# Patient Record
Sex: Male | Born: 1950
Health system: Southern US, Community
[De-identification: ages and names within clinical notes are randomized; demographics above are authoritative.]

## PROBLEM LIST (undated history)

## (undated) DIAGNOSIS — I10 Essential (primary) hypertension: Secondary | ICD-10-CM

---

## 1998-08-12 ENCOUNTER — Emergency Department (HOSPITAL_COMMUNITY): Admission: EM | Admit: 1998-08-12 | Discharge: 1998-08-12 | Payer: Self-pay | Admitting: Emergency Medicine

## 2010-05-07 ENCOUNTER — Encounter: Payer: Self-pay | Admitting: Internal Medicine

## 2015-08-02 ENCOUNTER — Other Ambulatory Visit: Payer: Self-pay | Admitting: Internal Medicine

## 2015-08-02 DIAGNOSIS — Z139 Encounter for screening, unspecified: Secondary | ICD-10-CM

## 2015-08-11 ENCOUNTER — Other Ambulatory Visit: Payer: Self-pay | Admitting: Internal Medicine

## 2015-08-11 ENCOUNTER — Ambulatory Visit
Admission: RE | Admit: 2015-08-11 | Discharge: 2015-08-11 | Disposition: A | Discharge status: Home / Self Care | Payer: Medicare Other | Source: Ambulatory Visit | Attending: Internal Medicine | Admitting: Internal Medicine

## 2015-08-11 DIAGNOSIS — Z139 Encounter for screening, unspecified: Secondary | ICD-10-CM

## 2016-06-20 ENCOUNTER — Emergency Department (HOSPITAL_COMMUNITY)
Admission: EM | Admit: 2016-06-20 | Discharge: 2016-06-20 | Disposition: A | Discharge status: Home / Self Care | Payer: Medicare Other | Attending: Emergency Medicine | Admitting: Emergency Medicine

## 2016-06-20 ENCOUNTER — Encounter (HOSPITAL_COMMUNITY): Payer: Self-pay

## 2016-06-20 ENCOUNTER — Emergency Department (HOSPITAL_COMMUNITY): Payer: Medicare Other

## 2016-06-20 ENCOUNTER — Ambulatory Visit (INDEPENDENT_AMBULATORY_CARE_PROVIDER_SITE_OTHER): Payer: Medicare Other | Admitting: Physician Assistant

## 2016-06-20 VITALS — BP 128/82 | HR 95 | Temp 98.7°F | Resp 16 | Ht 72.0 in | Wt 198.0 lb

## 2016-06-20 DIAGNOSIS — S62521B Displaced fracture of distal phalanx of right thumb, initial encounter for open fracture: Secondary | ICD-10-CM | POA: Insufficient documentation

## 2016-06-20 DIAGNOSIS — W540XXA Bitten by dog, initial encounter: Secondary | ICD-10-CM

## 2016-06-20 DIAGNOSIS — I10 Essential (primary) hypertension: Secondary | ICD-10-CM | POA: Insufficient documentation

## 2016-06-20 DIAGNOSIS — Y929 Unspecified place or not applicable: Secondary | ICD-10-CM | POA: Insufficient documentation

## 2016-06-20 DIAGNOSIS — Y939 Activity, unspecified: Secondary | ICD-10-CM | POA: Diagnosis not present

## 2016-06-20 DIAGNOSIS — T148XXA Other injury of unspecified body region, initial encounter: Secondary | ICD-10-CM

## 2016-06-20 DIAGNOSIS — S61111A Laceration without foreign body of right thumb with damage to nail, initial encounter: Secondary | ICD-10-CM

## 2016-06-20 DIAGNOSIS — S61151A Open bite of right thumb with damage to nail, initial encounter: Secondary | ICD-10-CM | POA: Diagnosis present

## 2016-06-20 DIAGNOSIS — Y999 Unspecified external cause status: Secondary | ICD-10-CM | POA: Insufficient documentation

## 2016-06-20 DIAGNOSIS — Z79899 Other long term (current) drug therapy: Secondary | ICD-10-CM | POA: Insufficient documentation

## 2016-06-20 DIAGNOSIS — S61051A Open bite of right thumb without damage to nail, initial encounter: Secondary | ICD-10-CM

## 2016-06-20 HISTORY — DX: Essential (primary) hypertension: I10

## 2016-06-20 MED ORDER — AMOXICILLIN-POT CLAVULANATE 875-125 MG PO TABS: 1.0000 | ORAL_TABLET | Freq: Once | ORAL | Status: DC

## 2016-06-20 MED ORDER — MORPHINE SULFATE (PF) 4 MG/ML IV SOLN
4.0000 mg | Freq: Once | INTRAVENOUS | Status: AC
  Administered 2016-06-20: 4 mg via INTRAMUSCULAR
  Filled 2016-06-20: qty 1

## 2016-06-20 NOTE — ED Provider Notes (Signed)
[facility]-EMERGENCY DEPT Provider Note   CSN: [id number] Arrival date & time: 06/20/16  0845     History   Chief Complaint Chief Complaint  Patient presents with  . Animal Bite    HPI Billy Hicks is a 66 y.o. male.  HPI Patient presents immediately after sustaining injuries from a dog bite. Patient is generally well, was in his usual state of health until this morning. About 2 hours ago the patient tried to separate 2 dogs were fighting. He sustained several injuries to his arms, is a superficial, but his right thumb had multiple wounds. Since that time there's been pain persistently in the thumb, but no loss of sensation, no weakness. Patient went to urgent care, had initial evaluation, was sent here due to the severity of the right thumb wound. Patient had tetanus shot last month, and dogs have updated vaccination status, no rabies. No other injuries, no other complaints. Last meal was this morning, coffee with cream.   Past Medical History:  Diagnosis Date  . Hypertension     There are no active problems to display for this patient.   History reviewed. No pertinent surgical history.     Home Medications    Prior to Admission medications   Medication Sig Start Date End Date Taking? Authorizing Provider  valsartan-hydrochlorothiazide (DIOVAN-HCT) 80-12.5 MG tablet Take 1 tablet by mouth daily.   Yes Historical Provider, MD    Family History History reviewed. No pertinent family history.  Social History Social History  Substance Use Topics  . Smoking status: Never Smoker  . Smokeless tobacco: Never Used  . Alcohol use No     Allergies   Patient has no known allergies.   Review of Systems Review of Systems  Constitutional:       Per HPI, otherwise negative  HENT:       Per HPI, otherwise negative  Gastrointestinal: Negative for nausea and vomiting.  Endocrine:       Negative aside from HPI  Genitourinary:       Neg aside from HPI     Musculoskeletal:       Per HPI, otherwise negative  Skin: Positive for color change and wound.  Allergic/Immunologic: Negative for immunocompromised state.  Neurological: Negative for weakness and numbness.  Hematological: Negative.      Physical Exam Updated Vital Signs BP 135/90 (BP Location: Left Arm)   Pulse 73   Temp 97.8 F (36.6 C) (Oral)   Resp 18   SpO2 99%   Physical Exam  Constitutional: He is oriented to person, place, and time. He appears well-developed. No distress.  HENT:  Head: Normocephalic and atraumatic.  Eyes: Conjunctivae and EOM are normal.  Cardiovascular: Normal rate, regular rhythm and intact distal pulses.   Pulmonary/Chest: Effort normal. No stridor. No respiratory distress.  Musculoskeletal: He exhibits no edema.  C wound exam for description of injury. Patient has appropriate capacity for flexion, extension, opposition, opposition of the thumb  Neurological: He is alert and oriented to person, place, and time.  Patient has preserved sensation throughout the description of the thumb, strength grossly intact,  Skin: Skin is warm and dry.  Open wound of the thumb through the nail, with fragmentation of the nailbed, visible soft tissue. See picture.  Psychiatric: He has a normal mood and affect.  Nursing note and vitals reviewed.      ED Treatments / Results   Radiology Dg Finger Thumb Right  Result Date: 06/20/2016 CLINICAL DATA:  Dog bite  to rt thumb this a.m trying to break up dog fight between pts own dogs. Pain, laceration to distal rt thumb EXAM: RIGHT THUMB 2+V COMPARISON:  None. FINDINGS: Transverse minimally comminuted fracture across the tuft of the distal phalanx of the thumb. Major fracture fragments distracted approximately 2 mm. No extension to the articular surface. Overlying soft tissue swelling. IMPRESSION: 1. Minimally comminuted transverse presumably open fracture, tuft distal phalanx right thumb. Electronically Signed   By: Corlis Leak M.D.   On: 06/20/2016 09:13    After the initial evaluation I discussed patient's case with her hand surgery team.  Procedures Procedures (including critical care time)  Medications Ordered in ED Morphine  11:01 AM Patient will proceed to our surgical center, hand surgeon is awaiting his arrival.  Initial Impression / Assessment and Plan / ED Course  I have reviewed the triage vital signs and the nursing notes.  Pertinent labs & imaging results that were available during my care of the patient were reviewed by me and considered in my medical decision making (see chart for details).  Patient presents after sustaining an injury to his right hand from a dog bite. Patient has a complex wound with comminuted fracture through the nailbed. Given the dirty nature of the wound I discussed this case with our hand surgeon for definitive irrigation, evaluation. Patient is neurovascularly intact, has up-to-date tetanus status. Patient will proceed directly to our outpatient surgical center. Patient has not yet received his antibiotics, but will do so if there. Pain well-controlled here.    Gerhard Munch, MD 06/20/16 1102

## 2016-06-20 NOTE — ED Triage Notes (Signed)
Pt here with dog bite to rt thumb. Known dog and up to date on shots.  Urgent care called and states bite may be to bone.  Pt is up to date on his tetanus.

## 2016-06-20 NOTE — Patient Instructions (Addendum)
  Please go to [facility]: [address], [place], [place] [address] Follow-up with me in 1 week.   Thank you for coming in today. I hope you feel we met your needs.  Feel free to call [facility] if you have any questions or further requests.  Please consider signing up for MyChart if you do not already have it, as this is a great way to communicate with me.  Best,  Whitney McVey, PA-C  IF you received an x-ray today, you will receive an invoice from [facility] Radiology. Please contact [facility] at [phone] with questions or concerns regarding your invoice.   IF you received labwork today, you will receive an invoice from [place]. Please contact [facility] at [phone] with questions or concerns regarding your invoice.   Our billing staff will not be able to assist you with questions regarding bills from these companies.  You will be contacted with the lab results as soon as they are available. The fastest way to get your results is to activate your My Chart account. Instructions are located on the last page of this paperwork. If you have not heard from [place] regarding the results in 2 weeks, please contact this office.

## 2016-06-20 NOTE — Discharge Instructions (Signed)
Please be sure to proceed to the surgical Center. Dr. Mina MarbleWeingold and his team are awaiting your arrival.

## 2016-06-20 NOTE — Progress Notes (Signed)
   Billy Hicks  MRN: [id number] DOB: 01/02/1951  PCP: No primary care provider on file.  Subjective:  Pt is a pleasant 66 year old male who presents to clinic for dog bite on right thumb. He was breaking up a dog fight this morning when he was bit. This happened about an hour and a half ago. He has not cleaned it.  UTD tetanus shot. Denies numbness, tingling, weakness, fever, chills.   Review of Systems  Constitutional: Negative for chills, diaphoresis and fever.  Respiratory: Negative for cough, chest tightness, shortness of breath and wheezing.   Cardiovascular: Negative for chest pain and palpitations.  Gastrointestinal: Negative for abdominal pain, nausea and vomiting.  Skin: Positive for wound.    There are no active problems to display for this patient.   No current outpatient prescriptions on file prior to visit.   No current facility-administered medications on file prior to visit.     No Known Allergies   Objective:  BP 128/82 (BP Location: Right Arm, Patient Position: Sitting, Cuff Size: Normal)   Pulse 95   Temp 98.7 F (37.1 C) (Oral)   Resp 16   Ht 6' (1.829 m)   Wt 198 lb (89.8 kg)   SpO2 98%   BMI 26.85 kg/m   Physical Exam  Constitutional: He is oriented to person, place, and time and well-developed, well-nourished, and in no distress. No distress.  Cardiovascular: Normal rate, regular rhythm and normal heart sounds.   Neurological: He is alert and oriented to person, place, and time. GCS score is 15.  Skin: Skin is warm and dry.  Full-thickness laceration to medial aspect of right thumb, extending anteriorly to posterior. Laceration penetrates over half of nailbed. Possible involvement of distal phalanges.    Psychiatric: Mood, memory, affect and judgment normal.  Vitals reviewed.   Assessment and Plan :  1. Dog bite of right thumb, initial encounter 2. Laceration of right thumb with damage to nail, foreign body presence unspecified, initial  encounter - The extent of this injury is beyond the scope of what we can do here for this patient. Pt requires consultation with hand surgeon in addition to antibiotics. Will send him by personal vehicle to [facility]. I called and spoke with charge nurse to let them know he is on his way, they are expecting him. He understands and agrees.   Marco CollieWhitney Neola Worrall, PA-C  Primary Care at [facility] 06/20/2016 8:27 AM

## 2017-08-28 IMAGING — US US AORTA SCREENING (MEDICARE)
1 series · 10 of 10 positions shown · non-contrast
Comparison: None.

CLINICAL DATA: Medicare screening exam for abdominal aortic
aneurysm.

EXAM:
ABDOMINAL AORTA SCREENING ULTRASOUND
TECHNIQUE: Ultrasound examination of the abdominal aorta was performed as a
screening evaluation for abdominal aortic aneurysm.

[Series 1: us aorta screening (medicare) · 0.28mm/px · 10 of 10 slices shown]
[im 1/10]
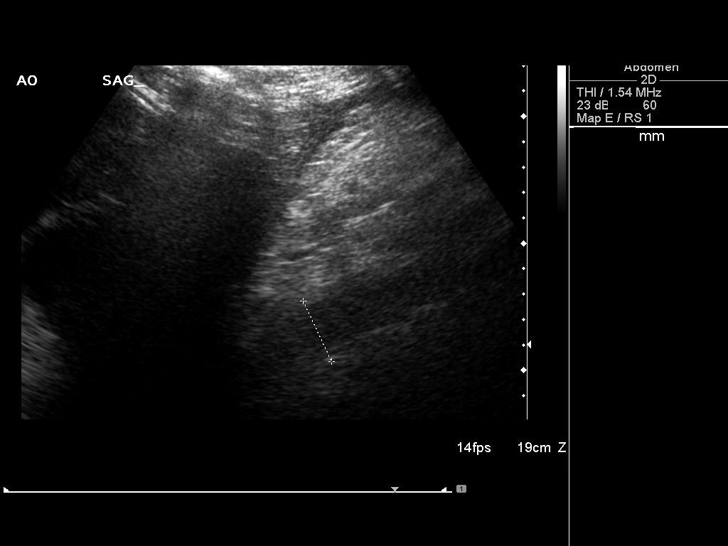
[im 2/10]
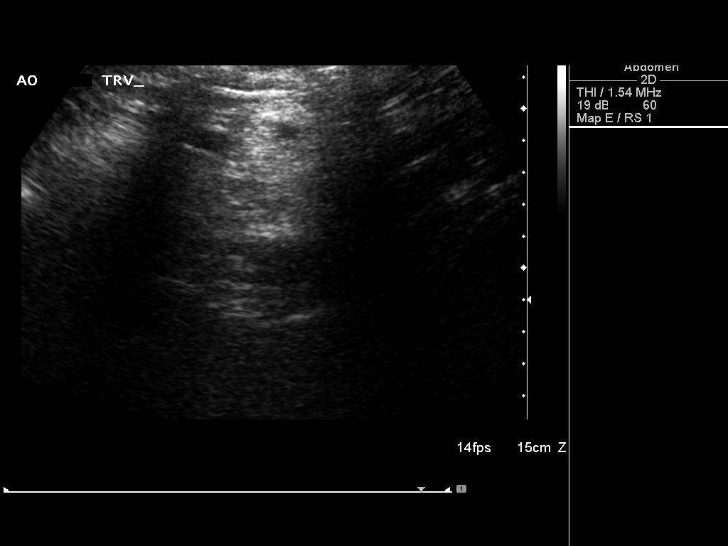
[im 3/10]
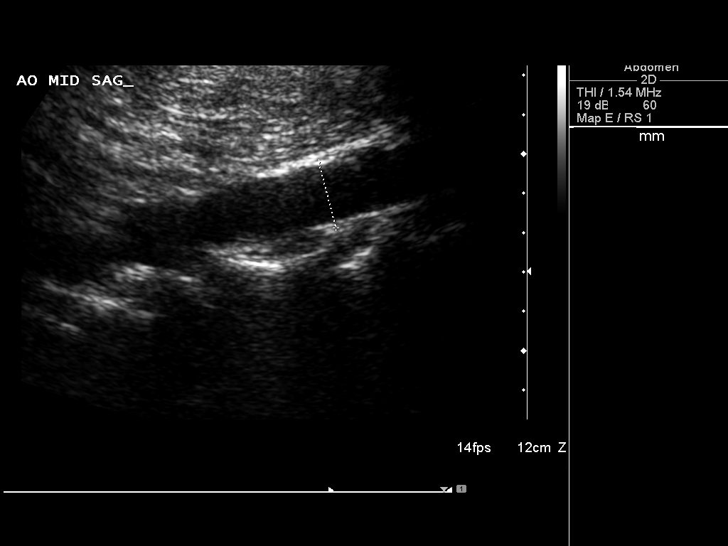
[im 4/10]
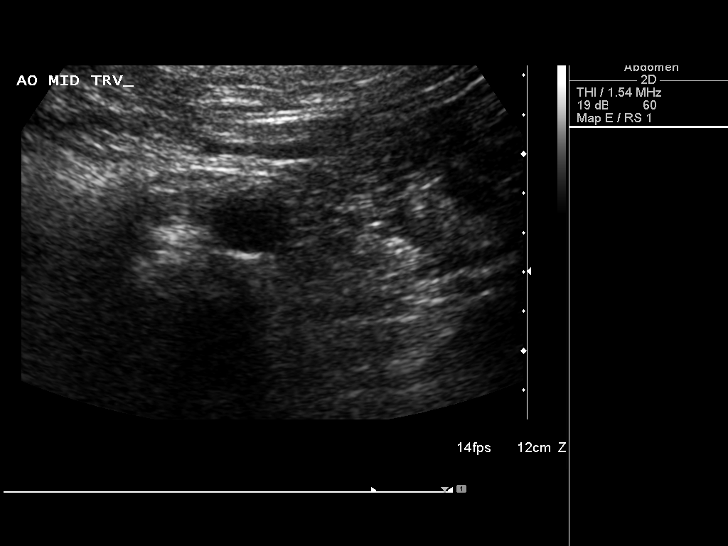
[im 5/10]
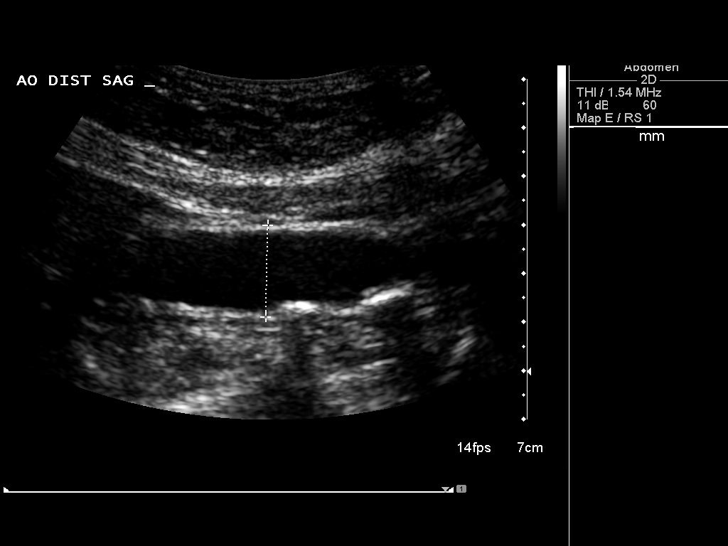
[im 6/10]
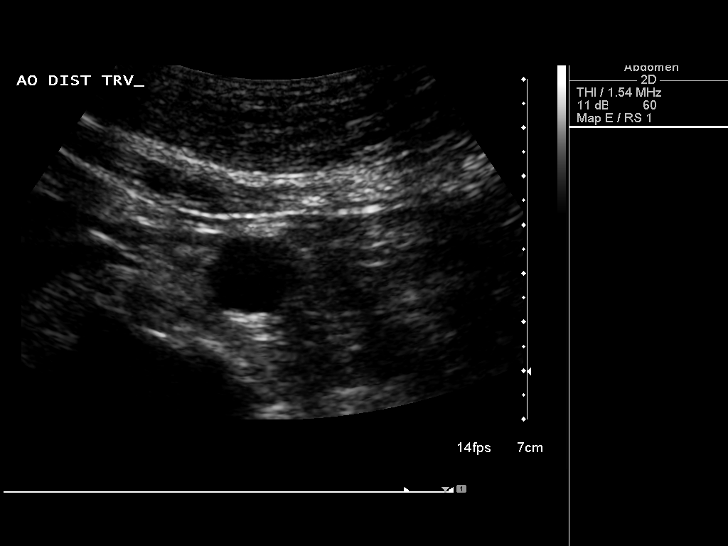
[im 7/10]
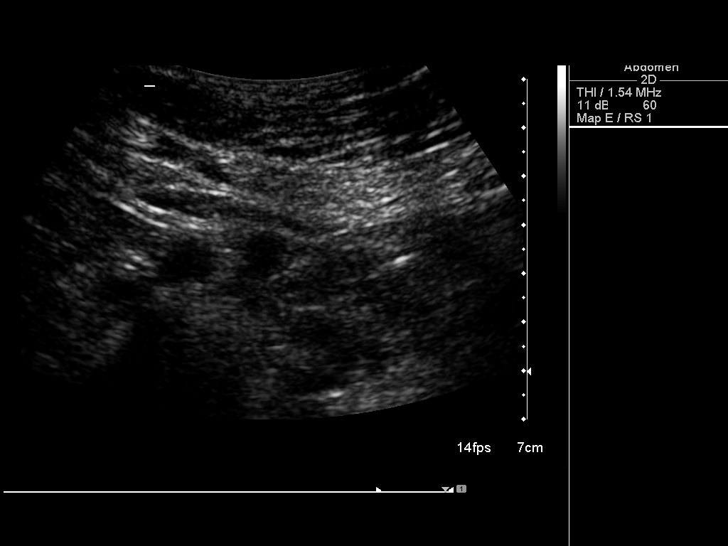
[im 8/10]
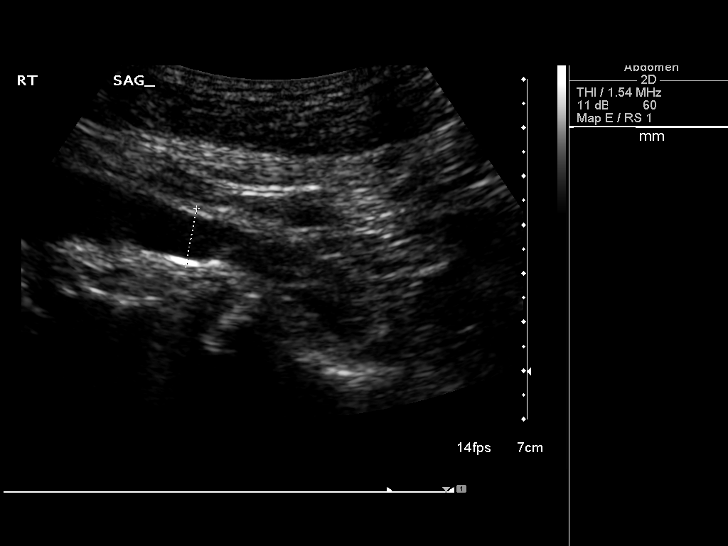
[im 9/10]
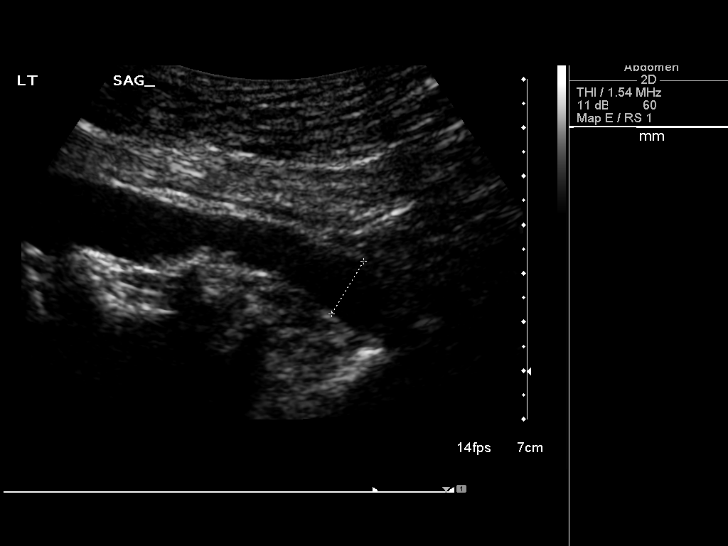
[im 10/10]
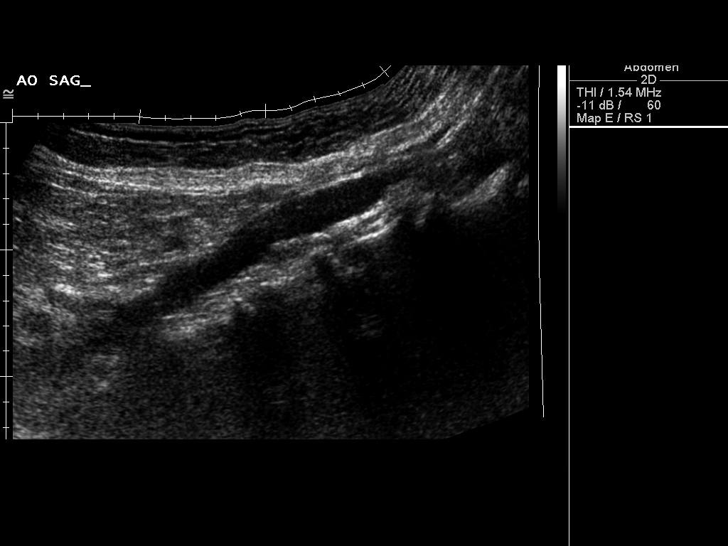

[10 of 10 positions shown; findings below may reference images not displayed]

FINDINGS: Abdominal Aorta

No aneurysm identified.  Diffuse atherosclerotic changes present.

Maximum Diameter: 2.6 cm
IMPRESSION: No aneurysm identified

## 2018-07-08 IMAGING — CR DG FINGER THUMB 2+V*R*
3 series · 3 of 3 positions shown · non-contrast
Comparison: None.

CLINICAL DATA: Dog bite to rt thumb this a.m trying to break up dog
fight between pts own dogs. Pain, laceration to distal rt thumb

EXAM:
RIGHT THUMB 2+V

[x finger pa right]
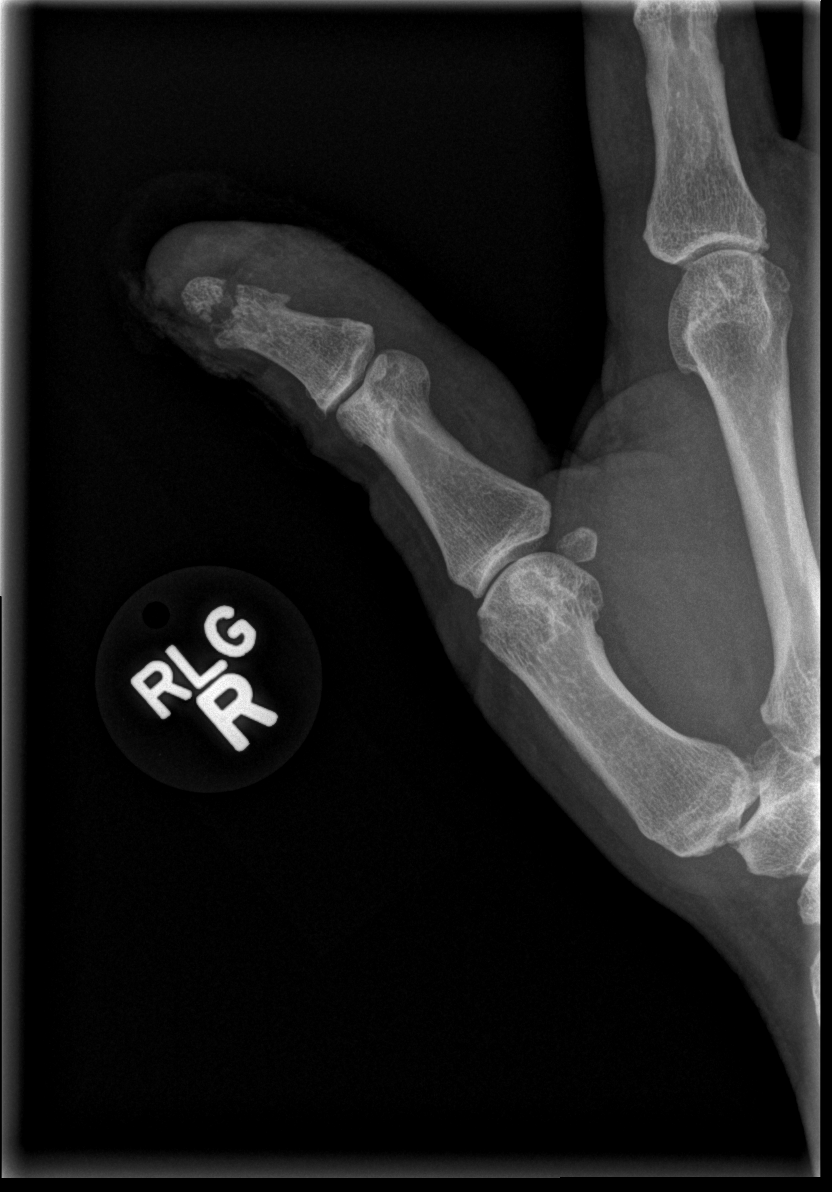

[x finger obl right]
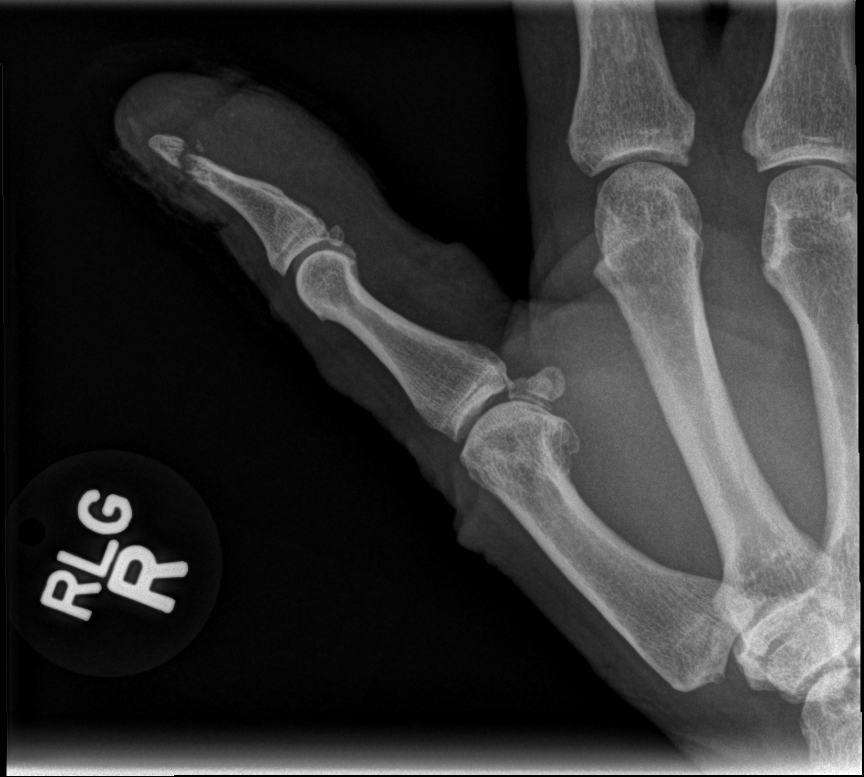

[x finger lat right]
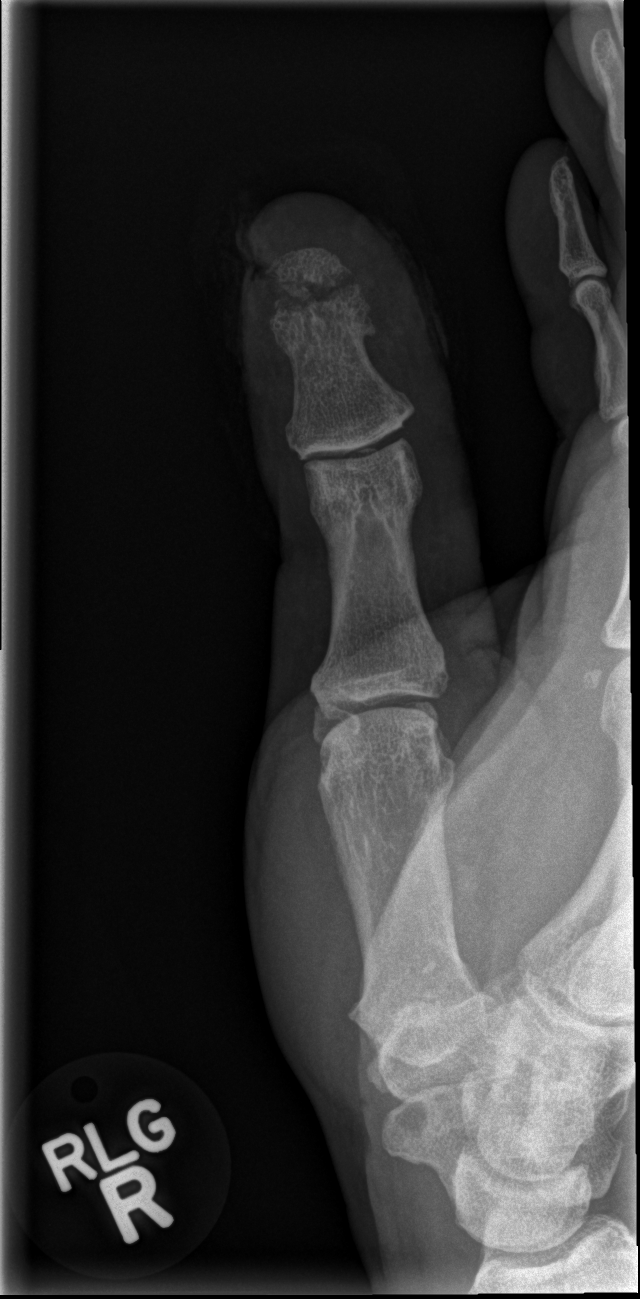

[3 of 3 positions shown; findings below may reference images not displayed]

FINDINGS: Transverse minimally comminuted fracture across the tuft of the
distal phalanx of the thumb. Major fracture fragments distracted
approximately 2 mm. No extension to the articular surface. Overlying
soft tissue swelling.
IMPRESSION: 1. Minimally comminuted transverse presumably open fracture, tuft
distal phalanx right thumb.

## 2019-05-05 ENCOUNTER — Ambulatory Visit: Payer: PPO | Attending: Internal Medicine

## 2019-05-05 DIAGNOSIS — Z23 Encounter for immunization: Secondary | ICD-10-CM | POA: Insufficient documentation

## 2019-05-05 NOTE — Progress Notes (Signed)
   Covid-19 Vaccination Clinic  Name:  Billy Hicks    MRN: [id number] DOB: 1950-12-07  05/05/2019  Mr. Billy Hicks was observed post Covid-19 immunization for 15 minutes without incidence. He was provided with Vaccine Information Sheet and instruction to access the V-Safe system.   Mr. Billy Hicks was instructed to call 911 with any severe reactions post vaccine: Marland Kitchen Difficulty breathing  . Swelling of your face and throat  . A fast heartbeat  . A bad rash all over your body  . Dizziness and weakness    Immunizations Administered    Name Date Dose VIS Date Route   Pfizer COVID-19 Vaccine 05/05/2019  3:30 PM 0.3 mL 03/27/2019 Intramuscular   Manufacturer: [employer], [employer]   Lot: [id number]   NDC: [address]-5

## 2019-05-25 ENCOUNTER — Ambulatory Visit: Payer: PPO | Attending: Internal Medicine

## 2019-05-25 DIAGNOSIS — Z23 Encounter for immunization: Secondary | ICD-10-CM

## 2019-05-25 NOTE — Progress Notes (Signed)
   Covid-19 Vaccination Clinic  Name:  Billy Hicks    MRN: [id number] DOB: 1950-11-16  05/25/2019  [name] was observed post Covid-19 immunization for 15 minutes without incidence. He was provided with Vaccine Information Sheet and instruction to access the V-Safe system.   [name] was instructed to call 911 with any severe reactions post vaccine: Marland Kitchen Difficulty breathing  . Swelling of your face and throat  . A fast heartbeat  . A bad rash all over your body  . Dizziness and weakness    Immunizations Administered    Name Date Dose VIS Date Route   Pfizer COVID-19 Vaccine 05/25/2019 12:41 PM 0.3 mL 03/27/2019 Intramuscular   Manufacturer: [employer], [employer]   Lot: [id number]   NDC: [address]-0

## 2019-08-25 DIAGNOSIS — Z0000 Encounter for general adult medical examination without abnormal findings: Secondary | ICD-10-CM | POA: Diagnosis not present

## 2019-08-25 DIAGNOSIS — I10 Essential (primary) hypertension: Secondary | ICD-10-CM | POA: Diagnosis not present

## 2019-08-25 DIAGNOSIS — N5201 Erectile dysfunction due to arterial insufficiency: Secondary | ICD-10-CM | POA: Diagnosis not present

## 2019-08-25 DIAGNOSIS — E7800 Pure hypercholesterolemia, unspecified: Secondary | ICD-10-CM | POA: Diagnosis not present

## 2019-08-25 DIAGNOSIS — R7301 Impaired fasting glucose: Secondary | ICD-10-CM | POA: Diagnosis not present

## 2019-08-25 DIAGNOSIS — Z1159 Encounter for screening for other viral diseases: Secondary | ICD-10-CM | POA: Diagnosis not present

## 2019-08-25 DIAGNOSIS — Z1389 Encounter for screening for other disorder: Secondary | ICD-10-CM | POA: Diagnosis not present

## 2019-09-28 DIAGNOSIS — N4340 Spermatocele of epididymis, unspecified: Secondary | ICD-10-CM | POA: Diagnosis not present

## 2019-09-28 DIAGNOSIS — S76211A Strain of adductor muscle, fascia and tendon of right thigh, initial encounter: Secondary | ICD-10-CM | POA: Diagnosis not present

## 2019-11-16 DIAGNOSIS — N503 Cyst of epididymis: Secondary | ICD-10-CM | POA: Diagnosis not present

## 2019-11-16 DIAGNOSIS — R311 Benign essential microscopic hematuria: Secondary | ICD-10-CM | POA: Diagnosis not present

## 2020-02-16 DIAGNOSIS — R311 Benign essential microscopic hematuria: Secondary | ICD-10-CM | POA: Diagnosis not present

## 2020-02-23 ENCOUNTER — Ambulatory Visit: Payer: PPO | Attending: Internal Medicine

## 2020-02-23 ENCOUNTER — Other Ambulatory Visit (HOSPITAL_BASED_OUTPATIENT_CLINIC_OR_DEPARTMENT_OTHER): Payer: Self-pay | Admitting: Internal Medicine

## 2020-02-23 DIAGNOSIS — Z23 Encounter for immunization: Secondary | ICD-10-CM

## 2020-02-23 MED FILL — FLUAD QUADRIVALENT 0.5 ML P: 0.5 | 1 days supply | Qty: 1 | Fill #0

## 2020-02-26 MED FILL — PFIZER-BIONTECH COVID-19 VA: 30 | 1 days supply | Qty: 0 | Fill #0

## 2020-03-01 DIAGNOSIS — I10 Essential (primary) hypertension: Secondary | ICD-10-CM | POA: Diagnosis not present

## 2020-08-30 DIAGNOSIS — E7800 Pure hypercholesterolemia, unspecified: Secondary | ICD-10-CM | POA: Diagnosis not present

## 2020-08-30 DIAGNOSIS — Z1389 Encounter for screening for other disorder: Secondary | ICD-10-CM | POA: Diagnosis not present

## 2020-08-30 DIAGNOSIS — M549 Dorsalgia, unspecified: Secondary | ICD-10-CM | POA: Diagnosis not present

## 2020-08-30 DIAGNOSIS — Z0000 Encounter for general adult medical examination without abnormal findings: Secondary | ICD-10-CM | POA: Diagnosis not present

## 2020-08-30 DIAGNOSIS — I10 Essential (primary) hypertension: Secondary | ICD-10-CM | POA: Diagnosis not present

## 2020-08-30 DIAGNOSIS — R7301 Impaired fasting glucose: Secondary | ICD-10-CM | POA: Diagnosis not present

## 2020-08-30 DIAGNOSIS — Z125 Encounter for screening for malignant neoplasm of prostate: Secondary | ICD-10-CM | POA: Diagnosis not present

## 2020-08-30 DIAGNOSIS — Z23 Encounter for immunization: Secondary | ICD-10-CM | POA: Diagnosis not present

## 2020-09-01 DIAGNOSIS — M549 Dorsalgia, unspecified: Secondary | ICD-10-CM | POA: Diagnosis not present

## 2020-09-08 DIAGNOSIS — M549 Dorsalgia, unspecified: Secondary | ICD-10-CM | POA: Diagnosis not present

## 2020-09-16 DIAGNOSIS — M549 Dorsalgia, unspecified: Secondary | ICD-10-CM | POA: Diagnosis not present

## 2020-09-21 DIAGNOSIS — M549 Dorsalgia, unspecified: Secondary | ICD-10-CM | POA: Diagnosis not present

## 2020-09-27 DIAGNOSIS — M549 Dorsalgia, unspecified: Secondary | ICD-10-CM | POA: Diagnosis not present

## 2020-10-05 DIAGNOSIS — M549 Dorsalgia, unspecified: Secondary | ICD-10-CM | POA: Diagnosis not present

## 2020-10-11 DIAGNOSIS — M9901 Segmental and somatic dysfunction of cervical region: Secondary | ICD-10-CM | POA: Diagnosis not present

## 2020-10-11 DIAGNOSIS — M5031 Other cervical disc degeneration,  high cervical region: Secondary | ICD-10-CM | POA: Diagnosis not present

## 2020-10-13 DIAGNOSIS — M9901 Segmental and somatic dysfunction of cervical region: Secondary | ICD-10-CM | POA: Diagnosis not present

## 2020-10-13 DIAGNOSIS — M5031 Other cervical disc degeneration,  high cervical region: Secondary | ICD-10-CM | POA: Diagnosis not present

## 2020-10-14 DIAGNOSIS — M549 Dorsalgia, unspecified: Secondary | ICD-10-CM | POA: Diagnosis not present

## 2020-10-18 DIAGNOSIS — M9901 Segmental and somatic dysfunction of cervical region: Secondary | ICD-10-CM | POA: Diagnosis not present

## 2020-10-18 DIAGNOSIS — M5031 Other cervical disc degeneration,  high cervical region: Secondary | ICD-10-CM | POA: Diagnosis not present

## 2020-10-20 DIAGNOSIS — M5031 Other cervical disc degeneration,  high cervical region: Secondary | ICD-10-CM | POA: Diagnosis not present

## 2020-10-20 DIAGNOSIS — M9901 Segmental and somatic dysfunction of cervical region: Secondary | ICD-10-CM | POA: Diagnosis not present

## 2020-10-27 DIAGNOSIS — M549 Dorsalgia, unspecified: Secondary | ICD-10-CM | POA: Diagnosis not present

## 2020-10-31 DIAGNOSIS — M5031 Other cervical disc degeneration,  high cervical region: Secondary | ICD-10-CM | POA: Diagnosis not present

## 2020-10-31 DIAGNOSIS — M9901 Segmental and somatic dysfunction of cervical region: Secondary | ICD-10-CM | POA: Diagnosis not present

## 2020-11-03 DIAGNOSIS — M9901 Segmental and somatic dysfunction of cervical region: Secondary | ICD-10-CM | POA: Diagnosis not present

## 2020-11-03 DIAGNOSIS — M5031 Other cervical disc degeneration,  high cervical region: Secondary | ICD-10-CM | POA: Diagnosis not present

## 2020-11-04 DIAGNOSIS — M549 Dorsalgia, unspecified: Secondary | ICD-10-CM | POA: Diagnosis not present

## 2020-11-14 DIAGNOSIS — M549 Dorsalgia, unspecified: Secondary | ICD-10-CM | POA: Diagnosis not present

## 2020-11-15 DIAGNOSIS — M5031 Other cervical disc degeneration,  high cervical region: Secondary | ICD-10-CM | POA: Diagnosis not present

## 2020-11-15 DIAGNOSIS — M9901 Segmental and somatic dysfunction of cervical region: Secondary | ICD-10-CM | POA: Diagnosis not present

## 2020-12-07 DIAGNOSIS — M5031 Other cervical disc degeneration,  high cervical region: Secondary | ICD-10-CM | POA: Diagnosis not present

## 2020-12-07 DIAGNOSIS — M9901 Segmental and somatic dysfunction of cervical region: Secondary | ICD-10-CM | POA: Diagnosis not present

## 2020-12-08 DIAGNOSIS — M549 Dorsalgia, unspecified: Secondary | ICD-10-CM | POA: Diagnosis not present

## 2020-12-15 DIAGNOSIS — M9901 Segmental and somatic dysfunction of cervical region: Secondary | ICD-10-CM | POA: Diagnosis not present

## 2020-12-15 DIAGNOSIS — M5031 Other cervical disc degeneration,  high cervical region: Secondary | ICD-10-CM | POA: Diagnosis not present

## 2020-12-20 DIAGNOSIS — M5031 Other cervical disc degeneration,  high cervical region: Secondary | ICD-10-CM | POA: Diagnosis not present

## 2020-12-20 DIAGNOSIS — M9901 Segmental and somatic dysfunction of cervical region: Secondary | ICD-10-CM | POA: Diagnosis not present

## 2020-12-26 DIAGNOSIS — M5031 Other cervical disc degeneration,  high cervical region: Secondary | ICD-10-CM | POA: Diagnosis not present

## 2020-12-26 DIAGNOSIS — M9901 Segmental and somatic dysfunction of cervical region: Secondary | ICD-10-CM | POA: Diagnosis not present

## 2021-01-02 DIAGNOSIS — M9901 Segmental and somatic dysfunction of cervical region: Secondary | ICD-10-CM | POA: Diagnosis not present

## 2021-01-02 DIAGNOSIS — M5031 Other cervical disc degeneration,  high cervical region: Secondary | ICD-10-CM | POA: Diagnosis not present

## 2021-01-09 DIAGNOSIS — M9901 Segmental and somatic dysfunction of cervical region: Secondary | ICD-10-CM | POA: Diagnosis not present

## 2021-01-09 DIAGNOSIS — M5031 Other cervical disc degeneration,  high cervical region: Secondary | ICD-10-CM | POA: Diagnosis not present

## 2021-01-16 DIAGNOSIS — M5031 Other cervical disc degeneration,  high cervical region: Secondary | ICD-10-CM | POA: Diagnosis not present

## 2021-01-16 DIAGNOSIS — M9901 Segmental and somatic dysfunction of cervical region: Secondary | ICD-10-CM | POA: Diagnosis not present

## 2021-01-26 DIAGNOSIS — M5031 Other cervical disc degeneration,  high cervical region: Secondary | ICD-10-CM | POA: Diagnosis not present

## 2021-01-26 DIAGNOSIS — M9901 Segmental and somatic dysfunction of cervical region: Secondary | ICD-10-CM | POA: Diagnosis not present

## 2021-02-07 DIAGNOSIS — M5031 Other cervical disc degeneration,  high cervical region: Secondary | ICD-10-CM | POA: Diagnosis not present

## 2021-02-07 DIAGNOSIS — M9901 Segmental and somatic dysfunction of cervical region: Secondary | ICD-10-CM | POA: Diagnosis not present

## 2021-02-20 DIAGNOSIS — M5031 Other cervical disc degeneration,  high cervical region: Secondary | ICD-10-CM | POA: Diagnosis not present

## 2021-02-20 DIAGNOSIS — M9901 Segmental and somatic dysfunction of cervical region: Secondary | ICD-10-CM | POA: Diagnosis not present

## 2021-03-06 DIAGNOSIS — M5031 Other cervical disc degeneration,  high cervical region: Secondary | ICD-10-CM | POA: Diagnosis not present

## 2021-03-06 DIAGNOSIS — M9901 Segmental and somatic dysfunction of cervical region: Secondary | ICD-10-CM | POA: Diagnosis not present

## 2021-03-07 DIAGNOSIS — N5201 Erectile dysfunction due to arterial insufficiency: Secondary | ICD-10-CM | POA: Diagnosis not present

## 2021-03-07 DIAGNOSIS — Z23 Encounter for immunization: Secondary | ICD-10-CM | POA: Diagnosis not present

## 2021-03-07 DIAGNOSIS — I10 Essential (primary) hypertension: Secondary | ICD-10-CM | POA: Diagnosis not present

## 2021-03-21 DIAGNOSIS — M9901 Segmental and somatic dysfunction of cervical region: Secondary | ICD-10-CM | POA: Diagnosis not present

## 2021-03-21 DIAGNOSIS — M5031 Other cervical disc degeneration,  high cervical region: Secondary | ICD-10-CM | POA: Diagnosis not present

## 2021-04-04 DIAGNOSIS — M5031 Other cervical disc degeneration,  high cervical region: Secondary | ICD-10-CM | POA: Diagnosis not present

## 2021-04-04 DIAGNOSIS — M9901 Segmental and somatic dysfunction of cervical region: Secondary | ICD-10-CM | POA: Diagnosis not present

## 2021-04-18 DIAGNOSIS — N5201 Erectile dysfunction due to arterial insufficiency: Secondary | ICD-10-CM | POA: Diagnosis not present

## 2021-04-18 DIAGNOSIS — M5031 Other cervical disc degeneration,  high cervical region: Secondary | ICD-10-CM | POA: Diagnosis not present

## 2021-04-18 DIAGNOSIS — Z7184 Encounter for health counseling related to travel: Secondary | ICD-10-CM | POA: Diagnosis not present

## 2021-04-18 DIAGNOSIS — M9901 Segmental and somatic dysfunction of cervical region: Secondary | ICD-10-CM | POA: Diagnosis not present

## 2021-05-02 DIAGNOSIS — M5031 Other cervical disc degeneration,  high cervical region: Secondary | ICD-10-CM | POA: Diagnosis not present

## 2021-05-02 DIAGNOSIS — M9901 Segmental and somatic dysfunction of cervical region: Secondary | ICD-10-CM | POA: Diagnosis not present

## 2021-05-30 DIAGNOSIS — M5031 Other cervical disc degeneration,  high cervical region: Secondary | ICD-10-CM | POA: Diagnosis not present

## 2021-05-30 DIAGNOSIS — M9901 Segmental and somatic dysfunction of cervical region: Secondary | ICD-10-CM | POA: Diagnosis not present

## 2021-06-13 DIAGNOSIS — M5031 Other cervical disc degeneration,  high cervical region: Secondary | ICD-10-CM | POA: Diagnosis not present

## 2021-06-13 DIAGNOSIS — M9901 Segmental and somatic dysfunction of cervical region: Secondary | ICD-10-CM | POA: Diagnosis not present

## 2021-06-27 DIAGNOSIS — M9901 Segmental and somatic dysfunction of cervical region: Secondary | ICD-10-CM | POA: Diagnosis not present

## 2021-06-27 DIAGNOSIS — M5031 Other cervical disc degeneration,  high cervical region: Secondary | ICD-10-CM | POA: Diagnosis not present

## 2021-07-11 DIAGNOSIS — M5031 Other cervical disc degeneration,  high cervical region: Secondary | ICD-10-CM | POA: Diagnosis not present

## 2021-07-11 DIAGNOSIS — M9901 Segmental and somatic dysfunction of cervical region: Secondary | ICD-10-CM | POA: Diagnosis not present

## 2021-08-02 DIAGNOSIS — M9901 Segmental and somatic dysfunction of cervical region: Secondary | ICD-10-CM | POA: Diagnosis not present

## 2021-08-02 DIAGNOSIS — M5031 Other cervical disc degeneration,  high cervical region: Secondary | ICD-10-CM | POA: Diagnosis not present

## 2021-08-16 DIAGNOSIS — M9901 Segmental and somatic dysfunction of cervical region: Secondary | ICD-10-CM | POA: Diagnosis not present

## 2021-08-16 DIAGNOSIS — M5031 Other cervical disc degeneration,  high cervical region: Secondary | ICD-10-CM | POA: Diagnosis not present

## 2021-08-29 DIAGNOSIS — M9901 Segmental and somatic dysfunction of cervical region: Secondary | ICD-10-CM | POA: Diagnosis not present

## 2021-08-29 DIAGNOSIS — M5031 Other cervical disc degeneration,  high cervical region: Secondary | ICD-10-CM | POA: Diagnosis not present

## 2021-09-04 DIAGNOSIS — Z1331 Encounter for screening for depression: Secondary | ICD-10-CM | POA: Diagnosis not present

## 2021-09-04 DIAGNOSIS — R7301 Impaired fasting glucose: Secondary | ICD-10-CM | POA: Diagnosis not present

## 2021-09-04 DIAGNOSIS — I10 Essential (primary) hypertension: Secondary | ICD-10-CM | POA: Diagnosis not present

## 2021-09-04 DIAGNOSIS — G8929 Other chronic pain: Secondary | ICD-10-CM | POA: Diagnosis not present

## 2021-09-04 DIAGNOSIS — E7800 Pure hypercholesterolemia, unspecified: Secondary | ICD-10-CM | POA: Diagnosis not present

## 2021-09-04 DIAGNOSIS — Z0000 Encounter for general adult medical examination without abnormal findings: Secondary | ICD-10-CM | POA: Diagnosis not present

## 2021-09-04 DIAGNOSIS — M542 Cervicalgia: Secondary | ICD-10-CM | POA: Diagnosis not present

## 2021-09-13 DIAGNOSIS — M9901 Segmental and somatic dysfunction of cervical region: Secondary | ICD-10-CM | POA: Diagnosis not present

## 2021-09-13 DIAGNOSIS — M5031 Other cervical disc degeneration,  high cervical region: Secondary | ICD-10-CM | POA: Diagnosis not present

## 2021-09-26 DIAGNOSIS — M5031 Other cervical disc degeneration,  high cervical region: Secondary | ICD-10-CM | POA: Diagnosis not present

## 2021-09-26 DIAGNOSIS — M9901 Segmental and somatic dysfunction of cervical region: Secondary | ICD-10-CM | POA: Diagnosis not present

## 2021-10-10 DIAGNOSIS — M5031 Other cervical disc degeneration,  high cervical region: Secondary | ICD-10-CM | POA: Diagnosis not present

## 2021-10-10 DIAGNOSIS — M9901 Segmental and somatic dysfunction of cervical region: Secondary | ICD-10-CM | POA: Diagnosis not present

## 2021-10-26 DIAGNOSIS — M5031 Other cervical disc degeneration,  high cervical region: Secondary | ICD-10-CM | POA: Diagnosis not present

## 2021-10-26 DIAGNOSIS — M9901 Segmental and somatic dysfunction of cervical region: Secondary | ICD-10-CM | POA: Diagnosis not present

## 2021-11-08 DIAGNOSIS — M5031 Other cervical disc degeneration,  high cervical region: Secondary | ICD-10-CM | POA: Diagnosis not present

## 2021-11-08 DIAGNOSIS — M9901 Segmental and somatic dysfunction of cervical region: Secondary | ICD-10-CM | POA: Diagnosis not present

## 2021-11-21 DIAGNOSIS — M9901 Segmental and somatic dysfunction of cervical region: Secondary | ICD-10-CM | POA: Diagnosis not present

## 2021-11-21 DIAGNOSIS — M5031 Other cervical disc degeneration,  high cervical region: Secondary | ICD-10-CM | POA: Diagnosis not present

## 2021-12-12 DIAGNOSIS — M5031 Other cervical disc degeneration,  high cervical region: Secondary | ICD-10-CM | POA: Diagnosis not present

## 2021-12-12 DIAGNOSIS — M9901 Segmental and somatic dysfunction of cervical region: Secondary | ICD-10-CM | POA: Diagnosis not present

## 2022-01-02 DIAGNOSIS — M5031 Other cervical disc degeneration,  high cervical region: Secondary | ICD-10-CM | POA: Diagnosis not present

## 2022-01-02 DIAGNOSIS — M9901 Segmental and somatic dysfunction of cervical region: Secondary | ICD-10-CM | POA: Diagnosis not present

## 2022-01-30 DIAGNOSIS — R7301 Impaired fasting glucose: Secondary | ICD-10-CM | POA: Diagnosis not present

## 2022-01-30 DIAGNOSIS — M9901 Segmental and somatic dysfunction of cervical region: Secondary | ICD-10-CM | POA: Diagnosis not present

## 2022-01-30 DIAGNOSIS — E7800 Pure hypercholesterolemia, unspecified: Secondary | ICD-10-CM | POA: Diagnosis not present

## 2022-01-30 DIAGNOSIS — M5031 Other cervical disc degeneration,  high cervical region: Secondary | ICD-10-CM | POA: Diagnosis not present

## 2022-01-30 DIAGNOSIS — I10 Essential (primary) hypertension: Secondary | ICD-10-CM | POA: Diagnosis not present

## 2022-04-05 DIAGNOSIS — M5031 Other cervical disc degeneration,  high cervical region: Secondary | ICD-10-CM | POA: Diagnosis not present

## 2022-04-05 DIAGNOSIS — M9901 Segmental and somatic dysfunction of cervical region: Secondary | ICD-10-CM | POA: Diagnosis not present

## 2022-04-23 DIAGNOSIS — M9901 Segmental and somatic dysfunction of cervical region: Secondary | ICD-10-CM | POA: Diagnosis not present

## 2022-04-23 DIAGNOSIS — M5031 Other cervical disc degeneration,  high cervical region: Secondary | ICD-10-CM | POA: Diagnosis not present

## 2022-05-07 DIAGNOSIS — M9901 Segmental and somatic dysfunction of cervical region: Secondary | ICD-10-CM | POA: Diagnosis not present

## 2022-05-07 DIAGNOSIS — M5031 Other cervical disc degeneration,  high cervical region: Secondary | ICD-10-CM | POA: Diagnosis not present

## 2022-07-23 ENCOUNTER — Encounter: Payer: Self-pay | Admitting: Internal Medicine

## 2022-07-23 ENCOUNTER — Encounter: Payer: Self-pay | Admitting: Orthopedic Surgery

## 2022-08-10 ENCOUNTER — Other Ambulatory Visit: Payer: Self-pay | Admitting: Internal Medicine

## 2022-08-10 ENCOUNTER — Ambulatory Visit
Admission: RE | Admit: 2022-08-10 | Discharge: 2022-08-10 | Disposition: A | Discharge status: Home / Self Care | Payer: PPO | Source: Ambulatory Visit | Attending: Internal Medicine | Admitting: Internal Medicine

## 2022-08-10 DIAGNOSIS — R053 Chronic cough: Secondary | ICD-10-CM

## 2022-09-04 ENCOUNTER — Ambulatory Visit (INDEPENDENT_AMBULATORY_CARE_PROVIDER_SITE_OTHER): Payer: PPO | Admitting: Pulmonary Disease

## 2022-09-04 ENCOUNTER — Encounter: Payer: Self-pay | Admitting: Pulmonary Disease

## 2022-09-04 VITALS — BP 124/70 | HR 78 | Temp 98.2°F | Ht 72.0 in | Wt 190.4 lb

## 2022-09-04 DIAGNOSIS — R053 Chronic cough: Secondary | ICD-10-CM | POA: Diagnosis not present

## 2022-09-04 DIAGNOSIS — J929 Pleural plaque without asbestos: Secondary | ICD-10-CM | POA: Diagnosis not present

## 2022-09-04 MED ORDER — FLUTICASONE-SALMETEROL 250-50 MCG/ACT IN AEPB: 1.0000 | INHALATION_SPRAY | Freq: Two times a day (BID) | RESPIRATORY_TRACT | 3 refills | Status: DC

## 2022-09-04 NOTE — Patient Instructions (Addendum)
Nice to meet you  For the pleural plaque and possible asbestosis related, I ordered a CT scan of the chest  For the cough use Advair 1 inhalation in the morning and 1 elation at night  For some additional structure and how to use it, you can always go to YouTube and search how to use Advair diskus  Make sure you rinse your mouth out with water after every use  Hopefully this will help with the cough  Return to clinic in 2 to 3 months or sooner as needed with Dr. Judeth Horn

## 2022-09-04 NOTE — Progress Notes (Signed)
@Patient  ID: Billy Hicks, male    DOB: [birth year], 72 y.o.   MRN: [id number]  Chief Complaint  Patient presents with   Consult    Pt states he had covid about 2 years ago and since then he has had problems with congestion. Pt had an xray performed which is the reason for today's visit. Denies any complaints of SOB.    Referring provider: Georgann Housekeeper, MD  HPI:   72 y.o. man whom we are seeing for evaluation of abnormal chest x-ray.  Most recent PCP note reviewed.  Chief complaint today is cough.  Present for 1-1/2 to 2 years.  May be started after COVID infection.  Productive.  Feels like congestion in his chest and back of throat.  Denies significant reflux or heartburn.  Denies postnasal drip sensation.  He does get seasonal allergies, rhinorrhea with seasonal changes, pollens but this is easily treated with over-the-counter remedies.  Does not think it worsens or contribute to his cough.  Seemingly as part of this workup, had a chest x-ray performed 07/2022 that on my review and interpretation reveals evidence of calcification lower lung likely pleural plaque, possible tiny pleural effusion versus volume loss on the right suggestive of possible asbestosis exposure.  He confirms asbestosis exposure.  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  Never worked in Set designer asbestosis or significant asbestosis work including remediation, Geophysicist/field seismologist, Runner, broadcasting/film/video.     Public house manager / Pulmonary Flowsheets:   ACT:      No data to display          MMRC:     No data to display          Epworth:      No data to display          Tests:   FENO:  No results found for: "NITRICOXIDE"  PFT:     No data to display          WALK:      No data to display          Imaging: Personally reviewed and as per EMR and discussion in this note DG Chest 2 View  Result Date:  08/15/2022 CLINICAL DATA:  Chronic cough. EXAM: CHEST - 2 VIEW COMPARISON:  None Available. FINDINGS: The heart size and mediastinal contours are within normal limits. Small right pleural effusion is noted. Probable pleural calcifications are seen in right hemithorax suggesting asbestos exposure. Minimal left basilar scarring is noted. The visualized skeletal structures are unremarkable. IMPRESSION: Right-sided pleural calcifications are noted suggesting asbestos exposure. Small right pleural effusion. Minimal left basilar scarring. Electronically Signed   By: Lupita Raider M.D.   On: 08/15/2022 10:19    Lab Results: Personally reviewed CBC No results found for: "WBC", "RBC", "HGB", "HCT", "PLT", "MCV", "MCH", "MCHC", "RDW", "LYMPHSABS", "MONOABS", "EOSABS", "BASOSABS"  BMET No results found for: "NA", "K", "CL", "CO2", "GLUCOSE", "BUN", "CREATININE", "CALCIUM", "GFRNONAA", "GFRAA"  BNP No results found for: "BNP"  ProBNP No results found for: "PROBNP"  Specialty Problems   None   No Known Allergies  Immunization History  Administered Date(s) Administered   PFIZER(Purple Top)SARS-COV-2 Vaccination 05/05/2019, 05/25/2019, 02/23/2020    Past Medical History:  Diagnosis Date   Hypertension     Tobacco History: Social History   Tobacco Use  Smoking Status Former   Packs/day: 1.00   Years: 20.00   Additional pack years: 0.00  Total pack years: 20.00   Types: Cigarettes   Start date: 1964   Quit date: 1984   Years since quitting: 40.4  Smokeless Tobacco Never   Counseling given: Not Answered   Continue to not smoke  Outpatient Encounter Medications as of 09/04/2022  Medication Sig   aspirin EC 81 MG tablet Take 81 mg by mouth daily. Swallow whole.   atorvastatin (LIPITOR) 80 MG tablet Take 80 mg by mouth daily.   fluticasone-salmeterol (ADVAIR) 250-50 MCG/ACT AEPB Inhale 1 puff into the lungs in the morning and at bedtime.   Multiple Vitamins-Minerals (MULTIVITAMIN  WITH MINERALS) tablet Take 1 tablet by mouth daily.   Pseudoephedrine HCl (SUDAFED CONGESTION PO) Take by mouth as needed.   valsartan-hydrochlorothiazide (DIOVAN-HCT) 80-12.5 MG tablet Take 1 tablet by mouth daily.   No facility-administered encounter medications on file as of 09/04/2022.     Review of Systems  Review of Systems  No chest pain exertion.  No orthopnea or PND.  Comprehensive review of systems otherwise negative. Physical Exam  BP 124/70 (BP Location: Left Arm, Patient Position: Sitting, Cuff Size: Normal)   Pulse 78   Temp 98.2 F (36.8 C) (Oral)   Ht 6' (1.829 m)   Wt 190 lb 6.4 oz (86.4 kg)   SpO2 97% Comment: RA  BMI 25.82 kg/m   Wt Readings from Last 5 Encounters:  09/04/22 190 lb 6.4 oz (86.4 kg)  06/20/16 198 lb (89.8 kg)    BMI Readings from Last 5 Encounters:  09/04/22 25.82 kg/m  06/20/16 26.85 kg/m     Physical Exam General: Sitting in chair, no acute distress Eyes: EOMI, icterus Neck: Supple, JVP Pulmonary: Clear, symmetric and even air entry bilaterally, no work of breathing Cardiovascular: Warm, no edema Abdomen: Single by sounds present MSK: No synovitis, no joint effusion Neuro: Normal gait, no weakness Psych: Normal mood, full affect   Assessment & Plan:   Likely pleural plaque: Seen on chest x-ray on the right.  Volume loss versus small effusion as well.  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  CT chest for further evaluation.  On exam, no sign of pleural effusion, small or tiny if present at all.  Certainly could be related to BAPE if asbestosis is correct diagnosis.  Chronic cough: Possibly triggered by COVID infection a couple years ago.  72 years of cough.  Productive of mucus.  Denies postnasal drip, GERD symptoms.  Trial Advair mid dose Diskus 1 puff twice daily for presumed cough variant asthma, triggered by viral infection.   Return in about 2 months (around  11/04/2022).   Karren Burly, MD 09/04/2022   This appointment required 45 minutes of patient care (this includes precharting, chart review, review of results, face-to-face care, etc.).

## 2022-10-08 ENCOUNTER — Ambulatory Visit
Admission: RE | Admit: 2022-10-08 | Discharge: 2022-10-08 | Disposition: A | Discharge status: Home / Self Care | Payer: PPO | Source: Ambulatory Visit | Attending: Pulmonary Disease | Admitting: Pulmonary Disease

## 2022-10-08 DIAGNOSIS — J929 Pleural plaque without asbestos: Secondary | ICD-10-CM

## 2022-10-12 ENCOUNTER — Telehealth: Payer: Self-pay | Admitting: Pulmonary Disease

## 2022-10-12 DIAGNOSIS — R911 Solitary pulmonary nodule: Secondary | ICD-10-CM

## 2022-10-12 DIAGNOSIS — N2889 Other specified disorders of kidney and ureter: Secondary | ICD-10-CM

## 2022-10-12 NOTE — Telephone Encounter (Signed)
PT calling for CT results and also wants the PCP listed to rec a copy. PT # is [phone]

## 2022-10-16 NOTE — Addendum Note (Signed)
Addended byVilma Meckel on: 10/16/2022 11:24 AM   Modules accepted: Orders

## 2022-10-16 NOTE — Addendum Note (Signed)
Addended by: Christen Butter on: 10/16/2022 09:59 AM   Modules accepted: Orders

## 2022-10-16 NOTE — Telephone Encounter (Signed)
Duplicate encounter

## 2022-10-16 NOTE — Telephone Encounter (Signed)
MRI ordered  thanks 

## 2022-10-16 NOTE — Telephone Encounter (Signed)
Yes.  Let's do the MRI and the one year CT scan.  Do you send this information to Dr Donette Larry?  I will copy the above message to him. Thanks Lezlie Octave   Dr [facility]- I have ordered the chest ct for a year. Can you please help with the MRI order? I am unsure how to order this. I have sent copy of recent chest CT to pt's PCP. Please advise, thanks!

## 2022-10-29 ENCOUNTER — Ambulatory Visit
Admission: RE | Admit: 2022-10-29 | Discharge: 2022-10-29 | Disposition: A | Discharge status: Home / Self Care | Payer: PPO | Source: Ambulatory Visit | Attending: Pulmonary Disease | Admitting: Pulmonary Disease

## 2022-10-29 DIAGNOSIS — N2889 Other specified disorders of kidney and ureter: Secondary | ICD-10-CM

## 2022-10-29 MED ORDER — GADOPICLENOL 0.5 MMOL/ML IV SOLN
7.0000 mL | Freq: Once | INTRAVENOUS | Status: AC | PRN
  Administered 2022-10-29: 7 mL via INTRAVENOUS

## 2022-11-05 NOTE — Progress Notes (Signed)
Spots on kidneys are benign cysts. No further imaging needed. Great news!

## 2022-11-13 ENCOUNTER — Ambulatory Visit: Payer: PPO | Admitting: Pulmonary Disease

## 2022-11-13 ENCOUNTER — Encounter: Payer: Self-pay | Admitting: Pulmonary Disease

## 2022-11-13 VITALS — BP 128/78 | HR 78 | Ht 71.0 in | Wt 189.2 lb

## 2022-11-13 DIAGNOSIS — J929 Pleural plaque without asbestos: Secondary | ICD-10-CM | POA: Diagnosis not present

## 2022-11-13 DIAGNOSIS — R053 Chronic cough: Secondary | ICD-10-CM

## 2022-11-13 MED ORDER — FLUTICASONE-SALMETEROL 250-50 MCG/ACT IN AEPB: 1.0000 | INHALATION_SPRAY | Freq: Two times a day (BID) | RESPIRATORY_TRACT | 11 refills | Status: DC

## 2022-11-13 NOTE — Patient Instructions (Addendum)
Nice to see you again - no changes  I refilled the Advair  RTC in 6 months or sooner as needed

## 2022-11-13 NOTE — Progress Notes (Signed)
@Patient  ID: Billy Hicks, male    DOB: [birth year], 72 y.o.   MRN: [id number]  Chief Complaint  Patient presents with   Follow-up    F/u ct and inhaler usage, pt has been using inhaler and states its helping     Referring provider: Georgann Housekeeper, MD  HPI:   72 y.o. man whom we are seeing for evaluation of abnormal chest x-ray with chief complaint of cough.  Returns for routine follow-up.  Concern for development of asthma.  Placed on mid dose Advair discus.  Marked improvement he estimates about 90% improvement in cough.  Some residual cough but again much improved.  He has chest x-ray in the past that showed some possible pleural plaques.  This was evaluated with CT scan.  This did demonstrate pleural plaques consistent with likely asbestosis.  No other concerning findings.  Discussed repeat follow-up in 1 year.  Lastly, incidental discovery of kidney lesions.  MRI was ordered and reviewed with him today.  These show cysts, benign, no further follow-up recommended.   HPI initial visit: Chief complaint today is cough.  Present for 1-1/2 to 2 years.  May be started after COVID infection.  Productive.  Feels like congestion in his chest and back of throat.  Denies significant reflux or heartburn.  Denies postnasal drip sensation.  He does get seasonal allergies, rhinorrhea with seasonal changes, pollens but this is easily treated with over-the-counter remedies.  Does not think it worsens or contribute to his cough.  Seemingly as part of this workup, had a chest x-ray performed 07/2022 that on my review and interpretation reveals evidence of calcification lower lung likely pleural plaque, possible tiny pleural effusion versus volume loss on the right suggestive of possible asbestosis exposure.  He confirms asbestosis exposure.  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  Never worked in Set designer asbestosis or significant asbestosis  work including remediation, Geophysicist/field seismologist, Runner, broadcasting/film/video.     Public house manager / Pulmonary Flowsheets:   ACT:      No data to display          MMRC:     No data to display          Epworth:      No data to display          Tests:   FENO:  No results found for: "NITRICOXIDE"  PFT:     No data to display          WALK:      No data to display          Imaging: Personally reviewed and as per EMR and discussion in this note MR ABDOMEN WWO CONTRAST  Result Date: 11/02/2022 CLINICAL DATA:  Left renal lesion on CT EXAM: MRI ABDOMEN WITHOUT AND WITH CONTRAST TECHNIQUE: Multiplanar multisequence MR imaging of the abdomen was performed both before and after the administration of intravenous contrast. CONTRAST:  9 mL Vueway IV COMPARISON:  Partial comparison to CT chest dated 10/08/2022 FINDINGS: Lower chest: Lung bases are clear. Hepatobiliary: Liver is within normal limits. No hepatic steatosis. No suspicious/enhancing hepatic lesions. Gallbladder is unremarkable. No intrahepatic or extrahepatic ductal dilatation. Pancreas:  Within normal limits. Spleen:  Within normal limits. Adrenals/Urinary Tract:  Adrenal glands are within normal limits. 9 mm hemorrhagic posterior upper pole renal lesion (series 12/image 37) corresponding to the CT abnormality, without enhancement following contrast administration, benign (Bosniak II). 2.6 cm  lateral right upper pole renal lesion with layering hemorrhage (series 5/image 8), without enhancement following contrast administration, benign (Bosniak II). 17 mm complex cystic lesion with thin nonenhancing septations (series 5/image 6), benign (Bosniak II). 5 mm hemorrhagic lesion in the anterior interpolar right kidney (series 12/image 48), without enhancement following contrast administration, benign (Bosniak II). 19 mm proteinaceous/hemorrhagic cyst along the right lower kidney (series 12/image 32), without enhancement  following contrast administration, benign (Bosniak II). Additional simple bilateral renal cysts measuring up to 4.2 cm in the posterior left lower kidney (series 5/image 17), benign (Bosniak I). No follow-up is recommended. No hydronephrosis. Stomach/Bowel: Stomach is within normal limits. Visualized bowel is unremarkable. Vascular/Lymphatic:  No evidence of abdominal aortic aneurysm. No suspicious abdominal lymphadenopathy. Other:  No abdominal ascites. Musculoskeletal: Lumbar levoscoliosis with degenerative changes. IMPRESSION: Bilateral renal cysts, as above, benign (Bosniak I-II). No follow-up is recommended. Electronically Signed   By: Charline Bills M.D.   On: 11/02/2022 13:40    Lab Results: Personally reviewed CBC No results found for: "WBC", "RBC", "HGB", "HCT", "PLT", "MCV", "MCH", "MCHC", "RDW", "LYMPHSABS", "MONOABS", "EOSABS", "BASOSABS"  BMET No results found for: "NA", "K", "CL", "CO2", "GLUCOSE", "BUN", "CREATININE", "CALCIUM", "GFRNONAA", "GFRAA"  BNP No results found for: "BNP"  ProBNP No results found for: "PROBNP"  Specialty Problems   None   No Known Allergies  Immunization History  Administered Date(s) Administered   PFIZER(Purple Top)SARS-COV-2 Vaccination 05/05/2019, 05/25/2019, 02/23/2020    Past Medical History:  Diagnosis Date   Hypertension     Tobacco History: Social History   Tobacco Use  Smoking Status Former   Current packs/day: 0.00   Average packs/day: 1 pack/day for 20.0 years (20.0 ttl pk-yrs)   Types: Cigarettes   Start date: 1964   Quit date: 1984   Years since quitting: 40.6  Smokeless Tobacco Never   Counseling given: Not Answered   Continue to not smoke  Outpatient Encounter Medications as of 11/13/2022  Medication Sig   aspirin EC 81 MG tablet Take 81 mg by mouth daily. Swallow whole.   atorvastatin (LIPITOR) 80 MG tablet Take 80 mg by mouth daily.   Multiple Vitamins-Minerals (MULTIVITAMIN WITH MINERALS) tablet Take 1  tablet by mouth daily.   Pseudoephedrine HCl (SUDAFED CONGESTION PO) Take by mouth as needed.   valsartan-hydrochlorothiazide (DIOVAN-HCT) 80-12.5 MG tablet Take 1 tablet by mouth daily.   [DISCONTINUED] fluticasone-salmeterol (ADVAIR) 250-50 MCG/ACT AEPB Inhale 1 puff into the lungs in the morning and at bedtime.   fluticasone-salmeterol (ADVAIR) 250-50 MCG/ACT AEPB Inhale 1 puff into the lungs in the morning and at bedtime.   No facility-administered encounter medications on file as of 11/13/2022.     Review of Systems  Review of Systems  N/a Physical Exam  BP 128/78   Pulse 78   Ht 5\' 11"  (1.803 m)   Wt 189 lb 3.2 oz (85.8 kg)   SpO2 98%   BMI 26.39 kg/m   Wt Readings from Last 5 Encounters:  11/13/22 189 lb 3.2 oz (85.8 kg)  09/04/22 190 lb 6.4 oz (86.4 kg)  06/20/16 198 lb (89.8 kg)    BMI Readings from Last 5 Encounters:  11/13/22 26.39 kg/m  09/04/22 25.82 kg/m  06/20/16 26.85 kg/m     Physical Exam General: Sitting in chair, no acute distress Eyes: EOMI, icterus Neck: Supple, JVP Pulmonary: Clear, symmetric and even air entry bilaterally, no work of breathing Cardiovascular: Warm, no edema Abdomen: Single by sounds present MSK: No synovitis, no joint effusion Neuro:  Normal gait, no weakness Psych: Normal mood, full affect   Assessment & Plan:   Pleural plaque presumably related to asbestosis exposure: Seen on chest x-ray on the right.  Volume loss versus small effusion as well.  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  CT chest for further evaluation 09/2022 demonstrated or confirmed pleural plaques with no marked other concerning findings.  No significant effusion.  Recommend yearly follow-up, repeat CT scan 09/2023.  Ordered today.  Chronic cough: Possibly triggered by COVID infection a couple years ago.  1.5 years of cough.  Productive of mucus.  Denies postnasal drip, GERD symptoms.  Advair mid dose Diskus  1 puff twice daily for presumed cough variant asthma, triggered by viral infection with marked improvement in symptoms.  To continue, refill today.   Return in about 6 months (around 05/16/2023).   Karren Burly, MD 11/13/2022   This appointment required 45 minutes of patient care (this includes precharting, chart review, review of results, face-to-face care, etc.).

## 2023-02-04 ENCOUNTER — Encounter: Payer: Self-pay | Admitting: Pulmonary Disease

## 2023-06-12 ENCOUNTER — Ambulatory Visit: Payer: PPO | Admitting: Pulmonary Disease

## 2023-06-12 ENCOUNTER — Encounter: Payer: Self-pay | Admitting: Pulmonary Disease

## 2023-06-12 VITALS — BP 122/66 | HR 72 | Temp 97.8°F | Ht 72.0 in | Wt 189.4 lb

## 2023-06-12 DIAGNOSIS — J929 Pleural plaque without asbestos: Secondary | ICD-10-CM | POA: Diagnosis not present

## 2023-06-12 DIAGNOSIS — R053 Chronic cough: Secondary | ICD-10-CM

## 2023-06-12 MED ORDER — FLUTICASONE-SALMETEROL 500-50 MCG/ACT IN AEPB: 1.0000 | INHALATION_SPRAY | Freq: Two times a day (BID) | RESPIRATORY_TRACT | 6 refills | Status: DC

## 2023-06-12 NOTE — Patient Instructions (Signed)
 Repeat CT scan in the summer, we can reconvene in 6 months to discuss results of the upper chest congestion's.  Sent in new prescription for the higher dose Advair, still 1 puff twice a day.  See if this helps improve the congestion.  Return to clinic in 6 months or sooner as needed

## 2023-06-12 NOTE — Progress Notes (Signed)
 @Patient  ID: Billy Hicks, male    DOB: 30-Dec-1950, 73 y.o.   MRN: [id number]  Chief Complaint  Patient presents with   Follow-up    Had cough and congestion .  Cough has improved.  Chest congestion still persistent.    Referring provider: Georgann Housekeeper, MD  HPI:   73 y.o. man whom we are seeing for evaluation of abnormal chest x-ray found to have pleural plaques exposure with chief complaint of chest congestion.  Returns for routine follow-up.  Cough remains well-controlled.  On med this Advair Diskus.  Still little chest congestion.  Feels like it is in his upper chest.  Denies postnasal drip.  No significant dyspnea etc.   HPI initial visit: Chief complaint today is cough.  Present for 1-1/2 to 2 years.  May be started after COVID infection.  Productive.  Feels like congestion in his chest and back of throat.  Denies significant reflux or heartburn.  Denies postnasal drip sensation.  He does get seasonal allergies, rhinorrhea with seasonal changes, pollens but this is easily treated with over-the-counter remedies.  Does not think it worsens or contribute to his cough.  Seemingly as part of this workup, had a chest x-ray performed 07/2022 that on my review and interpretation reveals evidence of calcification lower lung likely pleural plaque, possible tiny pleural effusion versus volume loss on the right suggestive of possible asbestosis exposure.  He confirms asbestosis exposure.  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  Never worked in Set designer asbestosis or significant asbestosis work including remediation, Geophysicist/field seismologist, Runner, broadcasting/film/video.     Public house manager / Pulmonary Flowsheets:   ACT:      No data to display          MMRC:     No data to display          Epworth:      No data to display          Tests:   FENO:  No results found for: "NITRICOXIDE"  PFT:     No data to  display          WALK:      No data to display          Imaging: Personally reviewed and as per EMR and discussion in this note No results found.  Lab Results: Personally reviewed CBC No results found for: "WBC", "RBC", "HGB", "HCT", "PLT", "MCV", "MCH", "MCHC", "RDW", "LYMPHSABS", "MONOABS", "EOSABS", "BASOSABS"  BMET No results found for: "NA", "K", "CL", "CO2", "GLUCOSE", "BUN", "CREATININE", "CALCIUM", "GFRNONAA", "GFRAA"  BNP No results found for: "BNP"  ProBNP No results found for: "PROBNP"  Specialty Problems   None   Allergies  Allergen Reactions   Lisinopril Cough    Immunization History  Administered Date(s) Administered   PFIZER(Purple Top)SARS-COV-2 Vaccination 05/05/2019, 05/25/2019, 02/23/2020    Past Medical History:  Diagnosis Date   Hypertension     Tobacco History: Social History   Tobacco Use  Smoking Status Former   Current packs/day: 0.00   Average packs/day: 1 pack/day for 20.0 years (20.0 ttl pk-yrs)   Types: Cigarettes   Start date: 1964   Quit date: 1984   Years since quitting: 41.1  Smokeless Tobacco Never   Counseling given: Not Answered   Continue to not smoke  Outpatient Encounter Medications as of 06/12/2023  Medication Sig   atorvastatin (LIPITOR) 80 MG tablet Take 80 mg by  mouth daily.   fluticasone-salmeterol (ADVAIR) 250-50 MCG/ACT AEPB Inhale 1 puff into the lungs in the morning and at bedtime.   Multiple Vitamins-Minerals (MULTIVITAMIN WITH MINERALS) tablet Take 1 tablet by mouth daily.   nystatin (MYCOSTATIN) 100000 UNIT/ML suspension Take 5 mLs by mouth 4 (four) times daily as needed (for thrush).   Pseudoephedrine HCl (SUDAFED CONGESTION PO) Take by mouth as needed.   valsartan-hydrochlorothiazide (DIOVAN-HCT) 80-12.5 MG tablet Take 1 tablet by mouth daily.   No facility-administered encounter medications on file as of 06/12/2023.     Review of Systems  Review of Systems  N/a Physical Exam  BP  122/66 (BP Location: Left Arm, Patient Position: Sitting, Cuff Size: Normal)   Pulse 72   Temp 97.8 F (36.6 C) (Oral)   Ht 6' (1.829 m)   Wt 189 lb 6.4 oz (85.9 kg)   SpO2 98%   BMI 25.69 kg/m   Wt Readings from Last 5 Encounters:  06/12/23 189 lb 6.4 oz (85.9 kg)  11/13/22 189 lb 3.2 oz (85.8 kg)  09/04/22 190 lb 6.4 oz (86.4 kg)  06/20/16 198 lb (89.8 kg)    BMI Readings from Last 5 Encounters:  06/12/23 25.69 kg/m  11/13/22 26.39 kg/m  09/04/22 25.82 kg/m  06/20/16 26.85 kg/m     Physical Exam General: Sitting in chair, no acute distress Eyes: EOMI, icterus Neck: Supple, JVP Pulmonary: Clear, symmetric and even air entry bilaterally, normal work of breathing Cardiovascular: Warm, no edema Abdomen: Single by sounds present MSK: No synovitis, no joint effusion Neuro: Normal gait, no weakness Psych: Normal mood, full affect   Assessment & Plan:   Pleural plaque presumably related to asbestosis exposure: Seen on chest x-ray on the right.  Volume loss versus small effusion as well.  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  CT chest for further evaluation 09/2022 confirmed pleural plaques with no other concerning findings.  No significant effusion.  Recommend yearly follow-up, repeat CT scan 09/2023.    Chronic cough: Possibly triggered by COVID infection a couple years ago.  1.5 years of cough.  Productive of mucus.  Denies postnasal drip, GERD symptoms.  Advair mid dose Diskus with essentially resolution of cough.  Escalating as below with some chest congestion.  Chest congestion: With chronic cough, likely asthma.  Marked improvement symptoms well with maintenance Advair discus.  Escalate to high dose to see if helps with additional congestion.   Return in about 6 months (around 12/10/2023) for f/u Dr. Judeth Horn.   Karren Burly, MD 06/12/2023

## 2023-09-16 ENCOUNTER — Encounter: Payer: Self-pay | Admitting: Pulmonary Disease

## 2023-10-01 ENCOUNTER — Other Ambulatory Visit (HOSPITAL_BASED_OUTPATIENT_CLINIC_OR_DEPARTMENT_OTHER): Payer: Self-pay | Admitting: Internal Medicine

## 2023-10-01 DIAGNOSIS — E7800 Pure hypercholesterolemia, unspecified: Secondary | ICD-10-CM

## 2023-10-15 ENCOUNTER — Ambulatory Visit
Admission: RE | Admit: 2023-10-15 | Discharge: 2023-10-15 | Disposition: A | Discharge status: Home / Self Care | Source: Ambulatory Visit | Attending: Pulmonary Disease | Admitting: Pulmonary Disease

## 2023-10-15 DIAGNOSIS — R911 Solitary pulmonary nodule: Secondary | ICD-10-CM

## 2023-10-16 ENCOUNTER — Ambulatory Visit (HOSPITAL_COMMUNITY)
Admission: RE | Admit: 2023-10-16 | Discharge: 2023-10-16 | Disposition: A | Discharge status: Home / Self Care | Payer: Self-pay | Source: Ambulatory Visit | Attending: Internal Medicine | Admitting: Internal Medicine

## 2023-10-16 DIAGNOSIS — E7800 Pure hypercholesterolemia, unspecified: Secondary | ICD-10-CM | POA: Insufficient documentation

## 2023-10-20 ENCOUNTER — Ambulatory Visit: Payer: Self-pay | Admitting: Pulmonary Disease

## 2024-02-11 ENCOUNTER — Other Ambulatory Visit: Payer: Self-pay | Admitting: Pulmonary Disease
